# Patient Record
Sex: Female | Born: 1970 | Race: White | Hispanic: No | Marital: Married | Smoking: Never smoker
Health system: Southern US, Community
[De-identification: ages and names within clinical notes are randomized; demographics above are authoritative.]

---

## 2014-05-29 ENCOUNTER — Ambulatory Visit: Payer: Self-pay | Admitting: Family Medicine

## 2014-12-08 ENCOUNTER — Ambulatory Visit: Payer: Self-pay | Admitting: Physician Assistant

## 2014-12-18 LAB — BETA STREP CULTURE(ARMC)

## 2016-04-15 IMAGING — CR DG KNEE COMPLETE 4+V*L*
4 series · 4 of 4 positions shown · non-contrast
Comparison: None.

CLINICAL DATA: Pain and swelling

EXAM:
LEFT KNEE - COMPLETE 4+ VIEW

[knee ap]
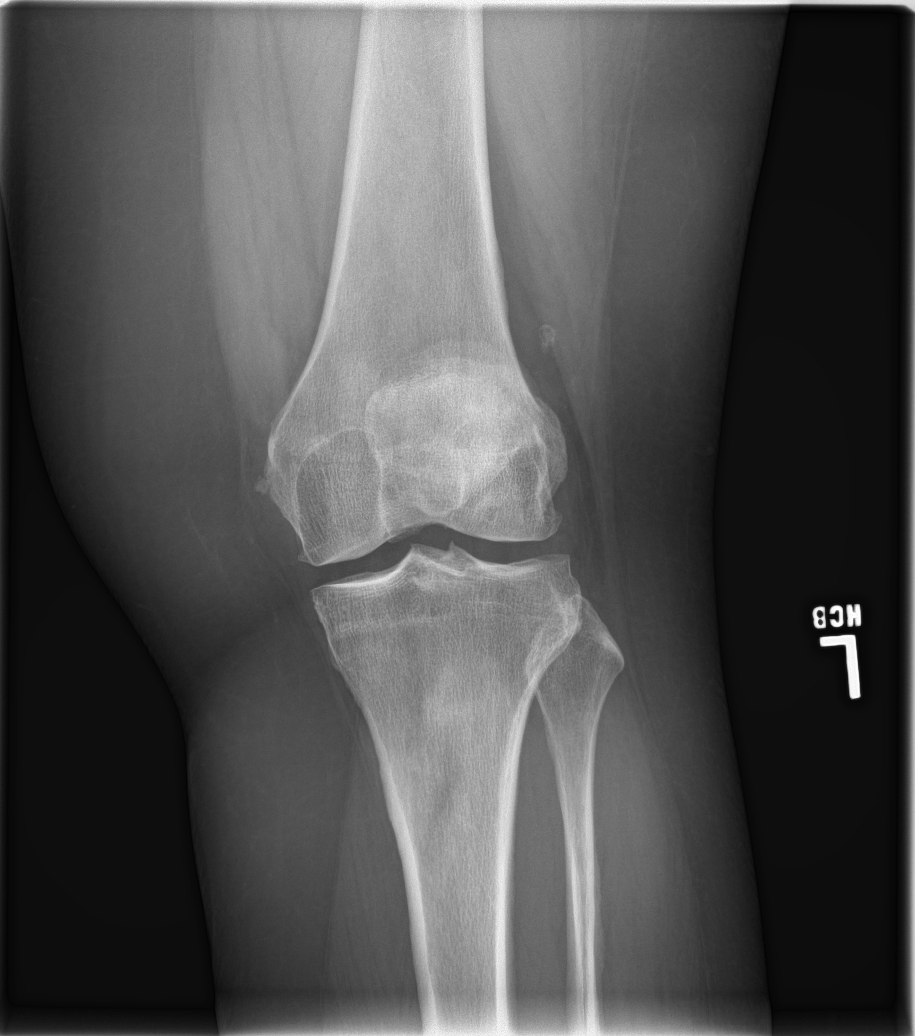

[knee obl (1 of 2)]
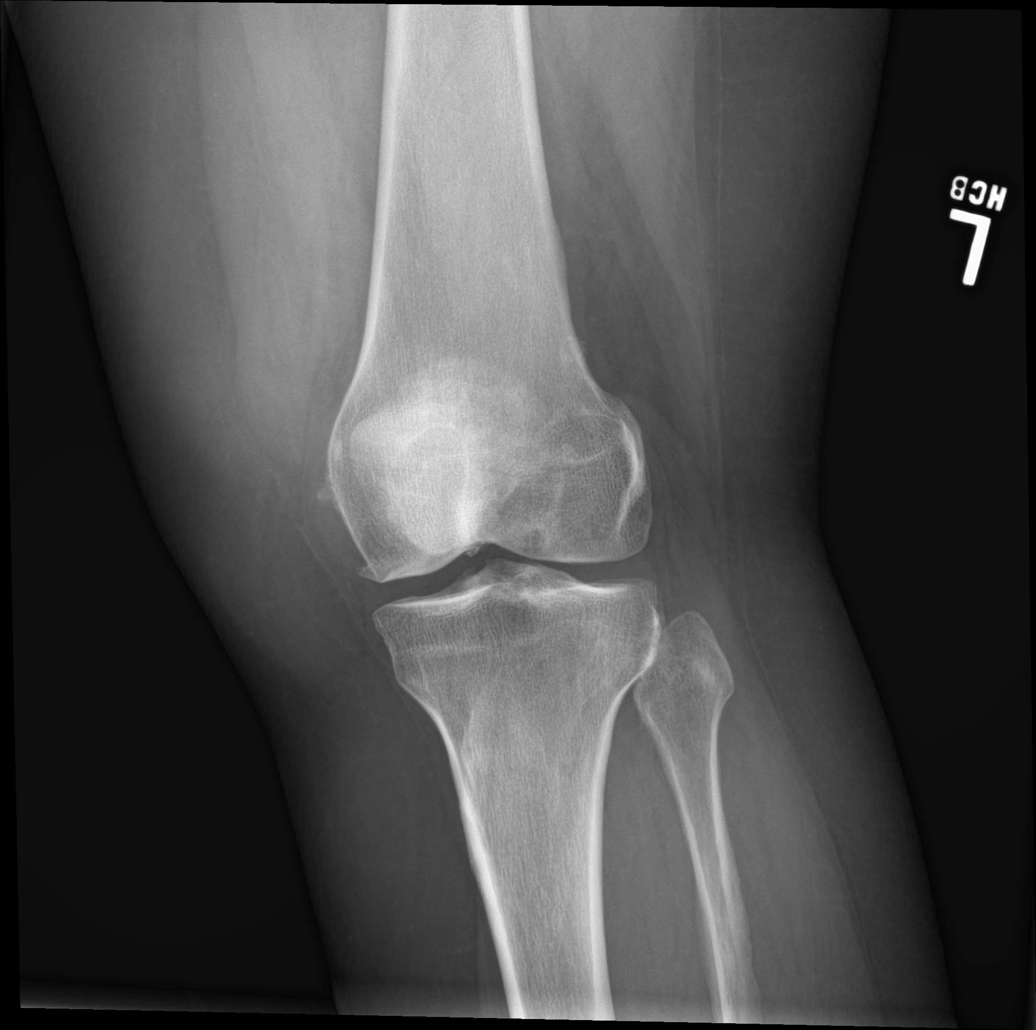

[knee obl (2 of 2)]
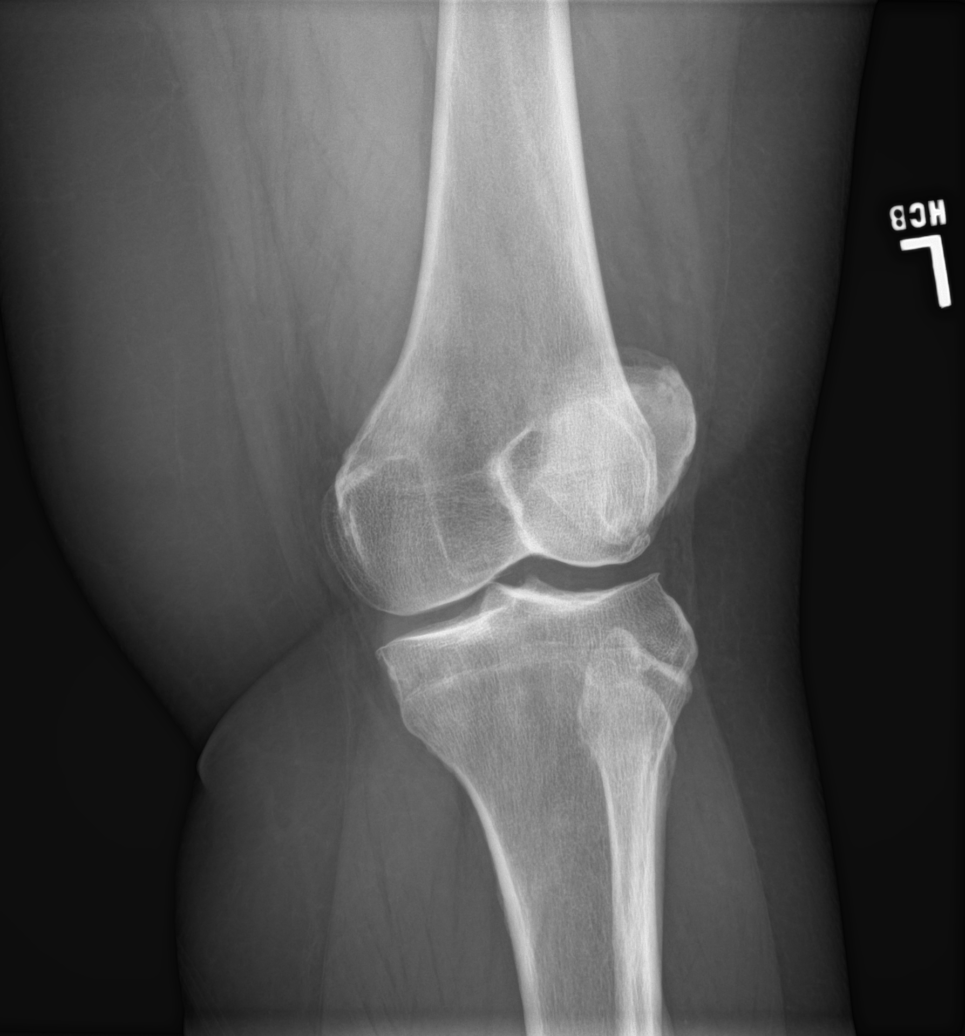

[knee lat]
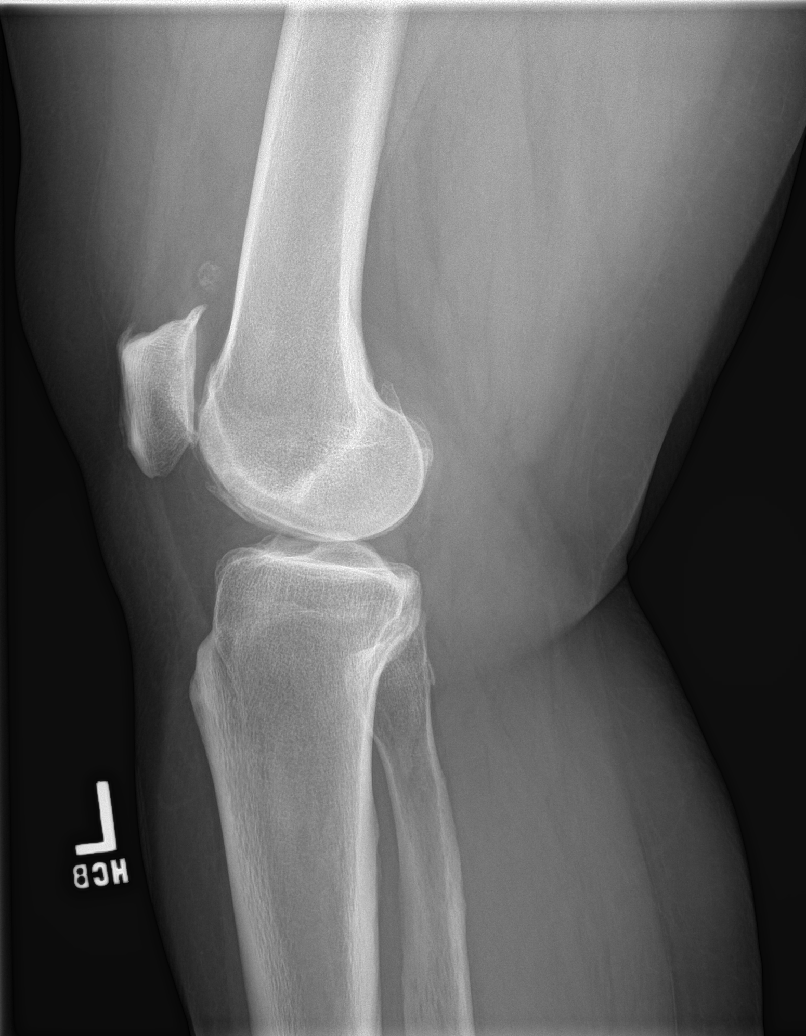

[4 of 4 positions shown; findings below may reference images not displayed]

FINDINGS: Mild degenerative changes are noted in all 3 joint compartments. No
significant joint effusion is seen. A calcification is noted
superior to the patella which may represent loose body. No other
focal abnormality is seen.
IMPRESSION: Degenerative changes without acute abnormality.

## 2016-07-25 ENCOUNTER — Ambulatory Visit (INDEPENDENT_AMBULATORY_CARE_PROVIDER_SITE_OTHER): Payer: No Typology Code available for payment source

## 2016-07-25 ENCOUNTER — Ambulatory Visit
Admission: EM | Admit: 2016-07-25 | Discharge: 2016-07-25 | Disposition: A | Discharge status: Home / Self Care | Payer: No Typology Code available for payment source | Attending: Family Medicine | Admitting: Family Medicine

## 2016-07-25 DIAGNOSIS — J189 Pneumonia, unspecified organism: Secondary | ICD-10-CM

## 2016-07-25 MED ORDER — ALBUTEROL SULFATE HFA 108 (90 BASE) MCG/ACT IN AERS: 1.0000 | INHALATION_SPRAY | Freq: Four times a day (QID) | RESPIRATORY_TRACT | 0 refills | Status: AC | PRN

## 2016-07-25 MED ORDER — AZITHROMYCIN 250 MG PO TABS: ORAL_TABLET | ORAL | 0 refills | Status: AC

## 2016-07-25 NOTE — ED Triage Notes (Signed)
Pt complains of not being able to take a good deep breath. It is easier to breath laying flat. She has anxiety with not being able to breath.

## 2016-07-25 NOTE — ED Provider Notes (Signed)
[facility]    CSN: [id number] Arrival date & time: 07/25/16  1048     History   Chief Complaint Chief Complaint  Patient presents with  . Respiratory Distress    Breathing Difficulty     HPI Brenda Marshall is a 45 y.o. female.   45 yo female with a c/o shortness of breath for 5 days, associated with intermittent dry coughing spells. States was exposed to co-worker diagnosed with pneumonia last week. Denies chest pain, fevers, chills, calf pain, recent surgery, prolonged immobilization, nasal congestion, runny nose.    The history is provided by the patient.    History reviewed. No pertinent past medical history.  There are no active problems to display for this patient.   History reviewed. No pertinent surgical history.  OB History    No data available       Home Medications    Prior to Admission medications   Medication Sig Start Date End Date Taking? Authorizing Provider  albuterol (PROVENTIL HFA;VENTOLIN HFA) 108 (90 Base) MCG/ACT inhaler Inhale 1-2 puffs into the lungs every 6 (six) hours as needed for wheezing or shortness of breath. 07/25/16   Payton Mccallumrlando Dajiah Kooi, MD  azithromycin (ZITHROMAX Z-PAK) 250 MG tablet 2 tabs po once day 1, then 1 tab po qd for next 4 days 07/25/16   Payton Mccallumrlando Hunter Pinkard, MD    Family History History reviewed. No pertinent family history.  Social History Social History  Substance Use Topics  . Smoking status: Never Smoker  . Smokeless tobacco: Never Used  . Alcohol use No     Allergies   Patient has no known allergies.   Review of Systems Review of Systems   Physical Exam Triage Vital Signs ED Triage Vitals  Enc Vitals Group     BP 07/25/16 1127 125/79     Pulse Rate 07/25/16 1127 70     Resp 07/25/16 1127 18     Temp 07/25/16 1127 98.2 F (36.8 C)     Temp Source 07/25/16 1127 Oral     SpO2 07/25/16 1127 98 %     Weight 07/25/16 1125 230 lb (104.3 kg)     Height 07/25/16 1125 5\' 1"  (1.549 m)     Head  Circumference --      Peak Flow --      Pain Score 07/25/16 1126 2     Pain Loc --      Pain Edu? --      Excl. in GC? --    No data found.   Updated Vital Signs BP 125/79 (BP Location: Left Arm)   Pulse 70   Temp 98.2 F (36.8 C) (Oral)   Resp 18   Ht 5\' 1"  (1.549 m)   Wt 230 lb (104.3 kg)   LMP 07/10/2016 (Exact Date) Comment: denies preg  SpO2 98%   BMI 43.46 kg/m   Visual Acuity Right Eye Distance:   Left Eye Distance:   Bilateral Distance:    Right Eye Near:   Left Eye Near:    Bilateral Near:     Physical Exam  Constitutional: She appears well-developed and well-nourished. No distress.  HENT:  Head: Normocephalic and atraumatic.  Right Ear: Tympanic membrane, external ear and ear canal normal.  Left Ear: Tympanic membrane, external ear and ear canal normal.  Nose: No mucosal edema, rhinorrhea, nose lacerations, sinus tenderness, nasal deformity, septal deviation or nasal septal hematoma. No epistaxis.  No foreign bodies. Right sinus exhibits no maxillary sinus  tenderness and no frontal sinus tenderness. Left sinus exhibits no maxillary sinus tenderness and no frontal sinus tenderness.  Mouth/Throat: Uvula is midline, oropharynx is clear and moist and mucous membranes are normal. No oropharyngeal exudate.  Eyes: Conjunctivae and EOM are normal. Pupils are equal, round, and reactive to light. Right eye exhibits no discharge. Left eye exhibits no discharge. No scleral icterus.  Neck: Normal range of motion. Neck supple. No thyromegaly present.  Cardiovascular: Normal rate, regular rhythm and normal heart sounds.   Pulmonary/Chest: Effort normal. No respiratory distress. She has no wheezes. She has rales (left lung).  Musculoskeletal: She exhibits no edema.  Lymphadenopathy:    She has no cervical adenopathy.  Skin: She is not diaphoretic.  Nursing note and vitals reviewed.    UC Treatments / Results  Labs (all labs ordered are listed, but only abnormal results  are displayed) Labs Reviewed - No data to display  EKG  EKG Interpretation None       Radiology Dg Chest 2 View  Result Date: 07/25/2016 CLINICAL DATA:  Nine days of productive cough; shortness of breath and left posterior chest discomfort for the past 3-4 days. EXAM: CHEST  2 VIEW COMPARISON:  No in PACs FINDINGS: The lungs are well-expanded. There is no focal infiltrate. There is no pleural effusion or pneumothorax. The heart and pulmonary vascularity are normal. The mediastinum is normal in width. The observed bony thorax exhibits no acute abnormality. IMPRESSION: There is no active cardiopulmonary disease. Electronically Signed   By: David  Swazil[place] M.D.   On: 07/25/2016 12:10    Procedures Procedures (including critical care time)  Medications Ordered in UC Medications - No data to display   Initial Impression / Assessment and Plan / UC Course  I have reviewed the triage vital signs and the nursing notes.  Pertinent labs & imaging results that were available during my care of the patient were reviewed by me and considered in my medical decision making (see chart for details).  Clinical Course       Final Clinical Impressions(s) / UC Diagnoses   Final diagnoses:  Atypical pneumonia    New Prescriptions Discharge Medication List as of 07/25/2016 12:37 PM    START taking these medications   Details  albuterol (PROVENTIL HFA;VENTOLIN HFA) 108 (90 Base) MCG/ACT inhaler Inhale 1-2 puffs into the lungs every 6 (six) hours as needed for wheezing or shortness of breath., Starting Tue 07/25/2016, Normal    azithromycin (ZITHROMAX Z-PAK) 250 MG tablet 2 tabs po once day 1, then 1 tab po qd for next 4 days, Normal       1. x-ray results and diagnosis reviewed with patient 2. rx as per orders above; reviewed possible side effects, interactions, risks and benefits  3. Recommend supportive treatment with rest, fluids 4. Follow-up prn if symptoms worsen or don't improve     Payton Mccallumrlando Trevone Prestwood, MD 07/25/16 1242

## 2016-07-28 ENCOUNTER — Telehealth: Payer: Self-pay | Admitting: *Deleted

## 2016-07-28 NOTE — Telephone Encounter (Signed)
Courtesy call back, verified DOB, patient reported feeling much better today. Advised patient to follow up with PCP or [facility] if symptoms return.

## 2017-05-18 ENCOUNTER — Ambulatory Visit
Admission: EM | Admit: 2017-05-18 | Discharge: 2017-05-18 | Disposition: A | Discharge status: Home / Self Care | Payer: 59 | Attending: Family Medicine | Admitting: Family Medicine

## 2017-05-18 DIAGNOSIS — J020 Streptococcal pharyngitis: Secondary | ICD-10-CM

## 2017-05-18 DIAGNOSIS — M26609 Unspecified temporomandibular joint disorder, unspecified side: Secondary | ICD-10-CM | POA: Diagnosis not present

## 2017-05-18 LAB — RAPID STREP SCREEN (MED CTR MEBANE ONLY): STREPTOCOCCUS, GROUP A SCREEN (DIRECT): POSITIVE — AB

## 2017-05-18 MED ORDER — PENICILLIN V POTASSIUM 500 MG PO TABS: 500.0000 mg | ORAL_TABLET | Freq: Three times a day (TID) | ORAL | 0 refills | Status: AC

## 2017-05-18 NOTE — ED Provider Notes (Signed)
[facility]    CSN: [id number] Arrival date & time: 05/18/17  1348     History   Chief Complaint Chief Complaint  Patient presents with  . Sore Throat    HPI Brenda Marshall is a 46 y.o. female.   46 yo female with a c/o right sided jaw pain for 4 days and 2 days of sore throat. States that occasionally she grinds her teeth at night while sleeping. Denies any injuries, fevers, chills.    The history is provided by the patient.  Sore Throat     History reviewed. No pertinent past medical history.  There are no active problems to display for this patient.   History reviewed. No pertinent surgical history.  OB History    No data available       Home Medications    Prior to Admission medications   Medication Sig Start Date End Date Taking? Authorizing Provider  albuterol (PROVENTIL HFA;VENTOLIN HFA) 108 (90 Base) MCG/ACT inhaler Inhale 1-2 puffs into the lungs every 6 (six) hours as needed for wheezing or shortness of breath. 07/25/16   Payton Mccallum, MD  azithromycin (ZITHROMAX Z-PAK) 250 MG tablet 2 tabs po once day 1, then 1 tab po qd for next 4 days 07/25/16   Payton Mccallum, MD  penicillin v potassium (VEETID) 500 MG tablet Take 1 tablet (500 mg total) by mouth 3 (three) times daily. 05/18/17   Payton Mccallum, MD    Family History History reviewed. No pertinent family history.  Social History Social History  Substance Use Topics  . Smoking status: Never Smoker  . Smokeless tobacco: Never Used  . Alcohol use No     Allergies   Patient has no known allergies.   Review of Systems Review of Systems   Physical Exam Triage Vital Signs ED Triage Vitals  Enc Vitals Group     BP 05/18/17 1400 (!) 146/89     Pulse Rate 05/18/17 1400 62     Resp 05/18/17 1400 16     Temp 05/18/17 1400 99.1 F (37.3 C)     Temp Source 05/18/17 1400 Oral     SpO2 05/18/17 1400 100 %     Weight 05/18/17 1402 243 lb 5 oz (110.4 kg)     Height 05/18/17 1402  5\' 1"  (1.549 m)     Head Circumference --      Peak Flow --      Pain Score 05/18/17 1402 3     Pain Loc --      Pain Edu? --      Excl. in GC? --    No data found.   Updated Vital Signs BP (!) 146/89 (BP Location: Left Arm)   Pulse 62   Temp 99.1 F (37.3 C) (Oral)   Resp 16   Ht 5\' 1"  (1.549 m)   Wt 243 lb 5 oz (110.4 kg)   LMP 05/04/2017   SpO2 100%   BMI 45.97 kg/m   Visual Acuity Right Eye Distance:   Left Eye Distance:   Bilateral Distance:    Right Eye Near:   Left Eye Near:    Bilateral Near:     Physical Exam  Constitutional: She appears well-developed and well-nourished. No distress.  HENT:  Right Ear: Tympanic membrane and external ear normal. No drainage.  Left Ear: External ear normal.  Mouth/Throat: Posterior oropharyngeal erythema present. No oropharyngeal exudate, posterior oropharyngeal edema or tonsillar abscesses. No tonsillar exudate.  Tenderness to palpation  over the right TMJ  Neck: Trachea normal.  Pulmonary/Chest: No stridor.  Skin: She is not diaphoretic.  Nursing note and vitals reviewed.    UC Treatments / Results  Labs (all labs ordered are listed, but only abnormal results are displayed) Labs Reviewed  RAPID STREP SCREEN (NOT AT [facility]) - Abnormal; Notable for the following:       Result Value   Streptococcus, Group A Screen (Direct) POSITIVE (*)    All other components within normal limits    EKG  EKG Interpretation None       Radiology No results found.  Procedures Procedures (including critical care time)  Medications Ordered in UC Medications - No data to display   Initial Impression / Assessment and Plan / UC Course  I have reviewed the triage vital signs and the nursing notes.  Pertinent labs & imaging results that were available during my care of the patient were reviewed by me and considered in my medical decision making (see chart for details).       Final Clinical Impressions(s) / UC Diagnoses    Final diagnoses:  TMJ (temporomandibular joint syndrome)  Strep pharyngitis    New Prescriptions Discharge Medication List as of 05/18/2017  2:48 PM    START taking these medications   Details  penicillin v potassium (VEETID) 500 MG tablet Take 1 tablet (500 mg total) by mouth 3 (three) times daily., Starting Fri 05/18/2017, Normal       1. Lab results and diagnosis reviewed with patient 2. rx as per orders above; reviewed possible side effects, interactions, risks and benefits  3. Recommend supportive treatment with salt water gargles, otc analgesics/NSAIDs  4. Follow-up prn if symptoms worsen or don't improve  Controlled Substance Prescriptions [place] Controlled Substance Registry consulted? Not Applicable   Payton Mccallumonty, Lorelee Mclaurin, MD 05/18/17 1950

## 2017-05-18 NOTE — Discharge Instructions (Signed)
Motrin/advil as need 3 times per day Ice to right jaw area

## 2017-05-18 NOTE — ED Triage Notes (Signed)
Pt with sore throat on right side and jaw pain 3/10 since Tuesday

## 2018-06-12 IMAGING — CR DG CHEST 2V
2 series · 2 of 2 positions shown · non-contrast
Comparison: No in PACs

CLINICAL DATA: Nine days of productive cough; shortness of breath
and left posterior chest discomfort for the past 3-4 days.

EXAM:
CHEST  2 VIEW

[chest pa]
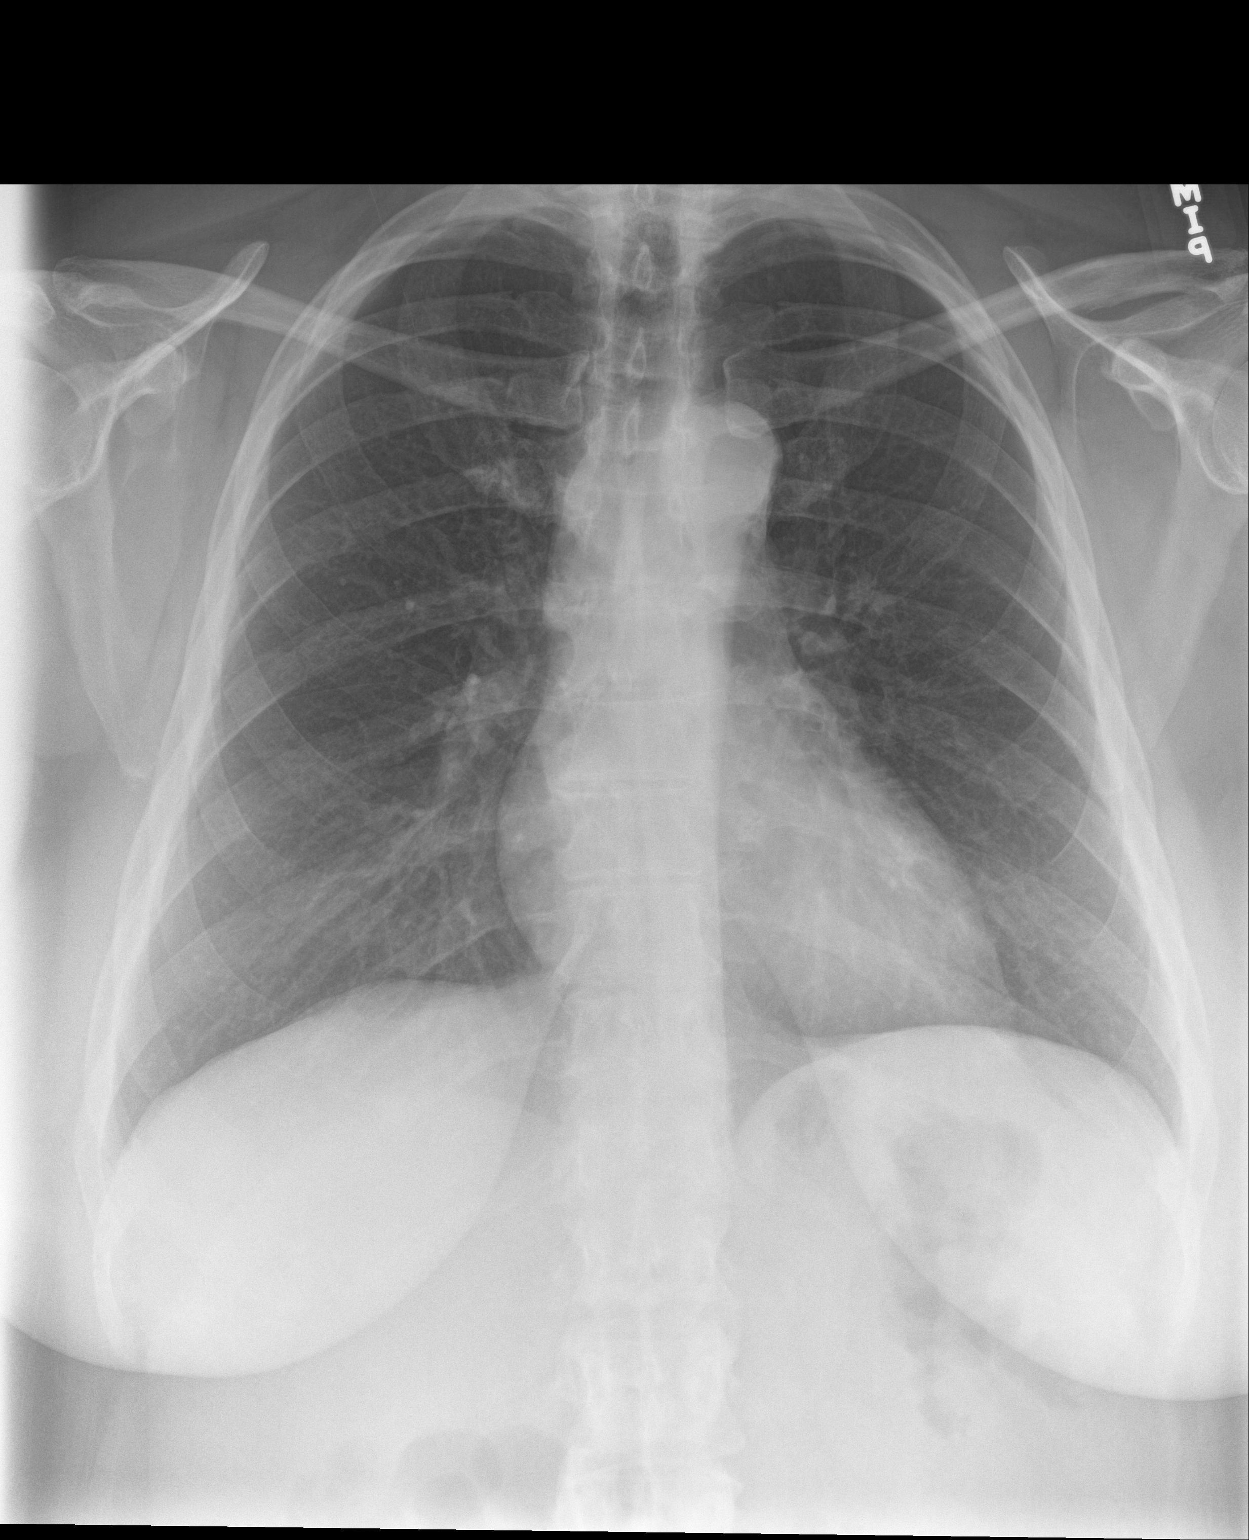

[chest lat]
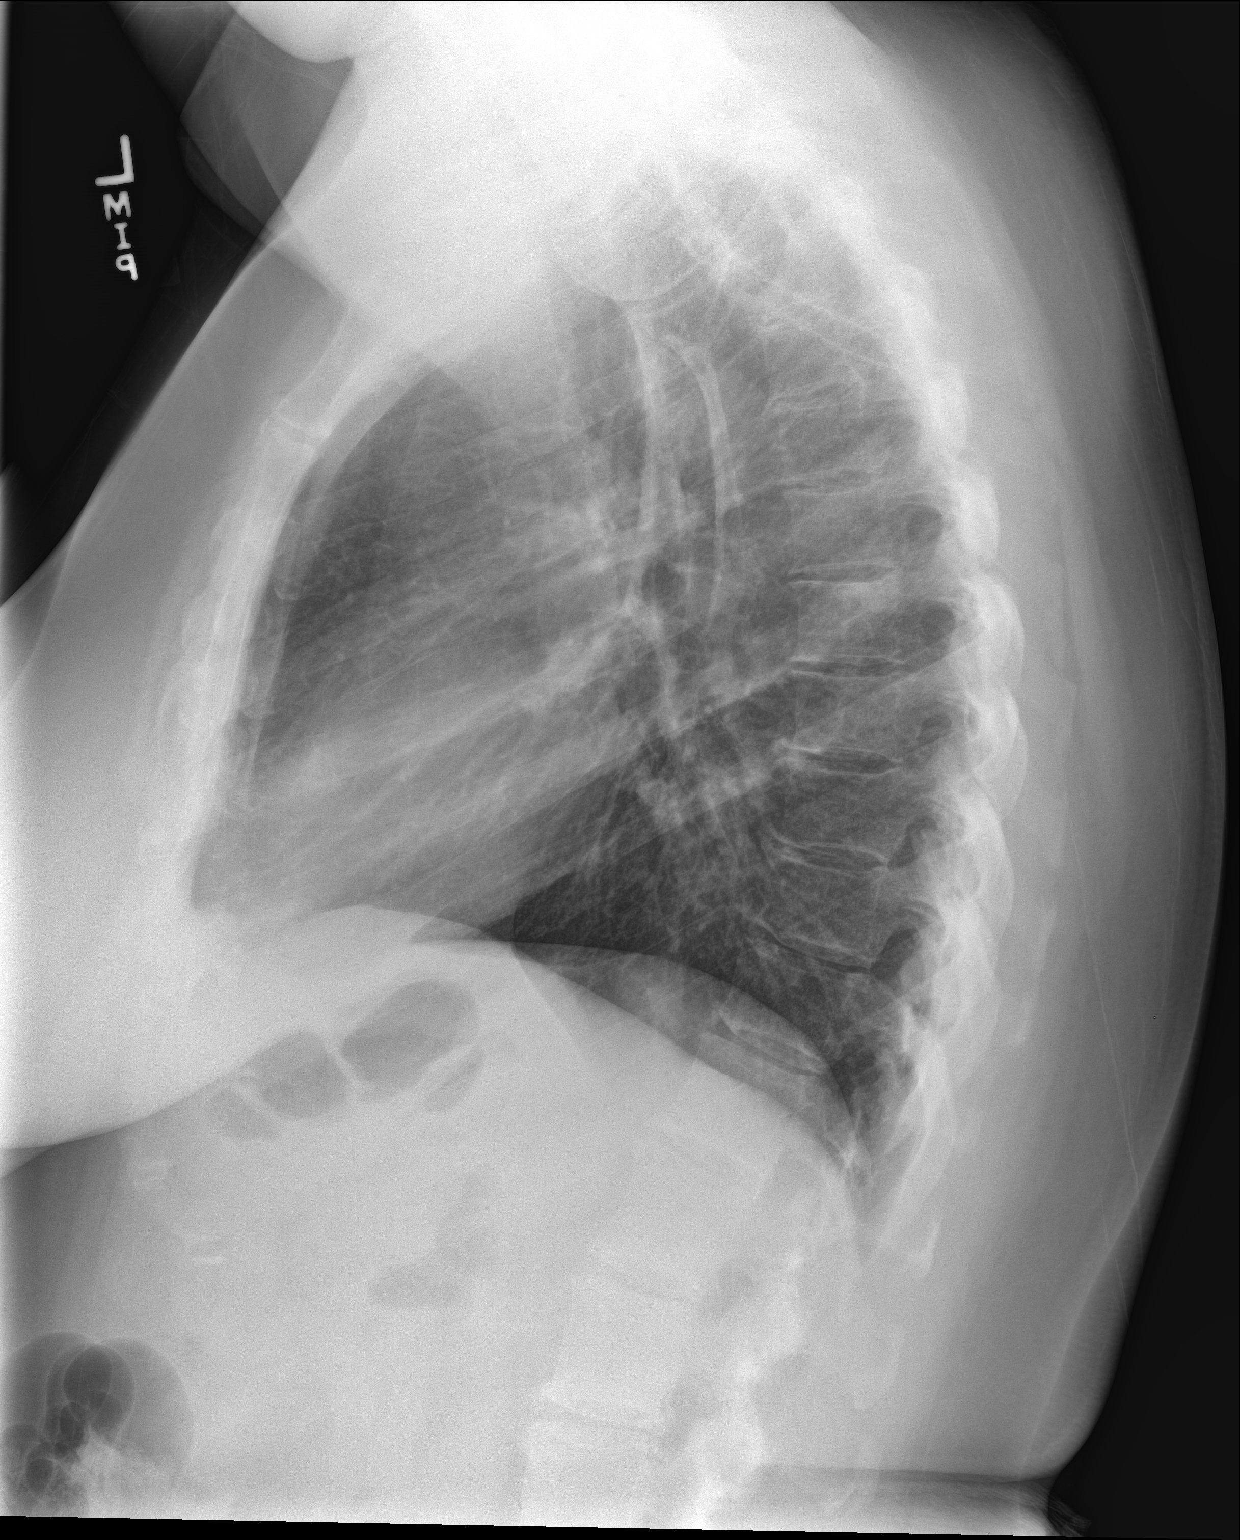

[2 of 2 positions shown; findings below may reference images not displayed]

FINDINGS: The lungs are well-expanded. There is no focal infiltrate. There is
no pleural effusion or pneumothorax. The heart and pulmonary
vascularity are normal. The mediastinum is normal in width. The
observed bony thorax exhibits no acute abnormality.
IMPRESSION: There is no active cardiopulmonary disease.

## 2020-06-16 ENCOUNTER — Telehealth: Payer: 59 | Admitting: Family

## 2020-06-16 DIAGNOSIS — T63424A Toxic effect of venom of ants, undetermined, initial encounter: Secondary | ICD-10-CM | POA: Diagnosis not present

## 2020-06-16 NOTE — Progress Notes (Signed)
E Visit for Insect Sting  Thank you for describing the insect sting for [place].  Here is how we plan to help!  Based on the information you have shared with me it looks like you have: An uncomplicated insect sting that just occurred and can be closely followed using the instructions in your care plan.  The 2 greatest risks from insect stings are allergic reaction, which can be fatal in some people and infection, which is more common and less serious.  Bees, wasps, yellow jackets, and hornets belong to a class of insects called Hymenoptera.  Most insect stings cause only minor discomfort.  Stings can happen anywhere on the body and can be painful.  Most stings are from honey bees or yellow jackets.  Fire ants can sting multiple times.  The sites of the stings are more likely to become infected.    Provided a home care guide for insect stings and instructions on when to call for help.   A lot of times it can cause scaring. Given you are not having any pain, I would continue to monitor it.   What can be used to prevent Insect Stings?   Insect repellant with at least 20% DEET.    Wearing long pants and shirts with socks and shoes.    Wear dark or drab-colored clothes rather than bright colors.    Avoid using perfumes and hair sprays; these attract insects.  HOME CARE ADVICE:  1. Stinger removal:  The stinger looks like a tiny black dot in the sting.  Use a fingernail, credit card edge, or knife-edge to scrape it off.  Don't pull it out because it squeezes out more venom.  If the stinger is below the skin surface, leave it alone.  It will be shed with normal skin healing. 2. Use cold compresses to the area of the sting for 10-20 minutes.  You may repeat this as needed to relieve symptoms of pain and swelling. 3.  For pain relief, take acetominophen 650 mg 4-6 hours as needed or ibuprofen 400 mg every 6-8 hours as needed or naproxen 250-500 mg every 12 hours as needed. 4.  You can  also use hydrocortisone cream 0.5% or 1% up to 4 times daily as needed for itching. 5.  If the sting becomes very itchy, take Benadryl 25-50 mg, follow directions on box. 6.  Wash the area 2-3 times daily with antibacterial soap and warm water. 7. Call your Doctor if:  Fever, a severe headache, or rash occur in the next 2 weeks.  Sting area begins to look infected.  Redness and swelling worsens after home treatment.  Your current symptoms become worse.    MAKE SURE YOU:   Understand these instructions.  Will watch your condition.  Will get help right away if you are not doing well or get worse.  Thank you for choosing an e-visit. Your e-visit answers were reviewed by a board certified advanced clinical practitioner to complete your personal care plan. Depending upon the condition, your plan could have included both over the counter or prescription medications. Please review your pharmacy choice. Be sure that the pharmacy you have chosen is open so that you can pick up your prescription now.  If there is a problem you may message your provider in MyChart to have the prescription routed to another pharmacy. Your safety is important to [place]. If you have drug allergies check your prescription carefully.  For the next 24 hours, you can use MyChart  to ask questions about today's visit, request a non-urgent call back, or ask for a work or school excuse from your e-visit provider. You will get an email in the next two days asking about your experience. I hope that your e-visit has been valuable and will speed your recovery.

## 2021-12-01 ENCOUNTER — Telehealth: Payer: 59 | Admitting: Physician Assistant

## 2021-12-01 DIAGNOSIS — R3989 Other symptoms and signs involving the genitourinary system: Secondary | ICD-10-CM

## 2021-12-01 MED ORDER — SULFAMETHOXAZOLE-TRIMETHOPRIM 800-160 MG PO TABS: 1.0000 | ORAL_TABLET | Freq: Two times a day (BID) | ORAL | 0 refills | Status: AC

## 2021-12-01 NOTE — Patient Instructions (Signed)
?Aris [place], thank you for joining Margaretann Loveless, PA-C for today's virtual visit.  While this provider is not your primary care provider (PCP), if your PCP is located in our provider database this encounter information will be shared with them immediately following your visit. ? ?Consent: ?(Patient) Brenda Marshall provided verbal consent for this virtual visit at the beginning of the encounter. ? ?Current Medications: ? ?Current Outpatient Medications:  ?  sulfamethoxazole-trimethoprim (BACTRIM DS) 800-160 MG tablet, Take 1 tablet by mouth 2 (two) times daily., Disp: 10 tablet, Rfl: 0 ?  albuterol (PROVENTIL HFA;VENTOLIN HFA) 108 (90 Base) MCG/ACT inhaler, Inhale 1-2 puffs into the lungs every 6 (six) hours as needed for wheezing or shortness of breath., Disp: 1 Inhaler, Rfl: 0 ?  azithromycin (ZITHROMAX Z-PAK) 250 MG tablet, 2 tabs po once day 1, then 1 tab po qd for next 4 days, Disp: 6 each, Rfl: 0 ?  penicillin v potassium (VEETID) 500 MG tablet, Take 1 tablet (500 mg total) by mouth 3 (three) times daily., Disp: 30 tablet, Rfl: 0  ? ?Medications ordered in this encounter:  ?Meds ordered this encounter  ?Medications  ? sulfamethoxazole-trimethoprim (BACTRIM DS) 800-160 MG tablet  ?  Sig: Take 1 tablet by mouth 2 (two) times daily.  ?  Dispense:  10 tablet  ?  Refill:  0  ?  Order Specific Question:   Supervising Provider  ?  Answer:   Eber Hong [[id number]]  ?  ? ?*If you need refills on other medications prior to your next appointment, please contact your pharmacy* ? ?Follow-Up: ?Call back or seek an in-person evaluation if the symptoms worsen or if the condition fails to improve as anticipated. ? ?Other Instructions ?Urinary Tract Infection, Adult ?A urinary tract infection (UTI) is an infection of any part of the urinary tract. The urinary tract includes the kidneys, ureters, bladder, and urethra. These organs make, store, and get rid of urine in the body. ?An upper UTI affects the ureters and  kidneys. A lower UTI affects the bladder and urethra. ?What are the causes? ?Most urinary tract infections are caused by bacteria in your genital area around your urethra, where urine leaves your body. These bacteria grow and cause inflammation of your urinary tract. ?What increases the risk? ?You are more likely to develop this condition if: ?You have a urinary catheter that stays in place. ?You are not able to control when you urinate or have a bowel movement (incontinence). ?You are female and you: ?Use a spermicide or diaphragm for birth control. ?Have low estrogen levels. ?Are pregnant. ?You have certain genes that increase your risk. ?You are sexually active. ?You take antibiotic medicines. ?You have a condition that causes your flow of urine to slow down, such as: ?An enlarged prostate, if you are female. ?Blockage in your urethra. ?A kidney [place]. ?A nerve condition that affects your bladder control (neurogenic bladder). ?Not getting enough to drink, or not urinating often. ?You have certain medical conditions, such as: ?Diabetes. ?A weak disease-fighting system (immunesystem). ?Sickle cell disease. ?Gout. ?Spinal cord injury. ?What are the signs or symptoms? ?Symptoms of this condition include: ?Needing to urinate right away (urgency). ?Frequent urination. This may include small amounts of urine each time you urinate. ?Pain or burning with urination. ?Blood in the urine. ?Urine that smells bad or unusual. ?Trouble urinating. ?Cloudy urine. ?Vaginal discharge, if you are female. ?Pain in the abdomen or the lower back. ?You may also have: ?Vomiting or a decreased appetite. ?  Confusion. ?Irritability or tiredness. ?A fever or chills. ?Diarrhea. ?The first symptom in older adults may be confusion. In some cases, they may not have any symptoms until the infection has worsened. ?How is this diagnosed? ?This condition is diagnosed based on your medical history and a physical exam. You may also have other tests,  including: ?Urine tests. ?Blood tests. ?Tests for STIs (sexually transmitted infections). ?If you have had more than one UTI, a cystoscopy or imaging studies may be done to determine the cause of the infections. ?How is this treated? ?Treatment for this condition includes: ?Antibiotic medicine. ?Over-the-counter medicines to treat discomfort. ?Drinking enough water to stay hydrated. ?If you have frequent infections or have other conditions such as a kidney [place], you may need to see a health care provider who specializes in the urinary tract (urologist). ?In rare cases, urinary tract infections can cause sepsis. Sepsis is a life-threatening condition that occurs when the body responds to an infection. Sepsis is treated in the hospital with IV antibiotics, fluids, and other medicines. ?Follow these instructions at home: ?Medicines ?Take over-the-counter and prescription medicines only as told by your health care provider. ?If you were prescribed an antibiotic medicine, take it as told by your health care provider. Do not stop using the antibiotic even if you start to feel better. ?General instructions ?Make sure you: ?Empty your bladder often and completely. Do not hold urine for long periods of time. ?Empty your bladder after sex. ?Wipe from front to back after urinating or having a bowel movement if you are female. Use each tissue only one time when you wipe. ?Drink enough fluid to keep your urine pale yellow. ?Keep all follow-up visits. This is important. ?Contact a health care provider if: ?Your symptoms do not get better after 1-2 days. ?Your symptoms go away and then return. ?Get help right away if: ?You have severe pain in your back or your lower abdomen. ?You have a fever or chills. ?You have nausea or vomiting. ?Summary ?A urinary tract infection (UTI) is an infection of any part of the urinary tract, which includes the kidneys, ureters, bladder, and urethra. ?Most urinary tract infections are caused by  bacteria in your genital area. ?Treatment for this condition often includes antibiotic medicines. ?If you were prescribed an antibiotic medicine, take it as told by your health care provider. Do not stop using the antibiotic even if you start to feel better. ?Keep all follow-up visits. This is important. ?This information is not intended to replace advice given to you by your health care provider. Make sure you discuss any questions you have with your health care provider. ?Document Revised: 04/16/2020 Document Reviewed: 04/16/2020 ?Elsevier Patient Education ? 2022 [employer] ? ? ? ?If you have been instructed to have an in-person evaluation today at a local Urgent Care facility, please use the link below. It will take you to a list of all of our available [facility], including address, phone number and hours of operation. Please do not delay care.  ?[facility] ? ?If you or a family member do not have a primary care provider, use the link below to schedule a visit and establish care. When you choose a [facility] primary care physician or advanced practice provider, you gain a long-term partner in health. ?Find a Primary Care Provider ? ?Learn more about [facility]'s in-office and virtual care options: ?[facility] - Get Care Now ?

## 2021-12-01 NOTE — Progress Notes (Signed)
?Virtual Visit Consent  ? ?Brenda Marshall, you are scheduled for a virtual visit with a [facility] provider today.   ?  ?Just as with appointments in the office, your consent must be obtained to participate.  Your consent will be active for this visit and any virtual visit you may have with one of our providers in the next 365 days.   ?  ?If you have a MyChart account, a copy of this consent can be sent to you electronically.  All virtual visits are billed to your insurance company just like a traditional visit in the office.   ? ?As this is a virtual visit, video technology does not allow for your provider to perform a traditional examination.  This may limit your provider's ability to fully assess your condition.  If your provider identifies any concerns that need to be evaluated in person or the need to arrange testing (such as labs, EKG, etc.), we will make arrangements to do so.   ?  ?Although advances in technology are sophisticated, we cannot ensure that it will always work on either your end or our end.  If the connection with a video visit is poor, the visit may have to be switched to a telephone visit.  With either a video or telephone visit, we are not always able to ensure that we have a secure connection.    ? ?I need to obtain your verbal consent now.   Are you willing to proceed with your visit today?  ?  ?Brenda Marshall has provided verbal consent on 12/01/2021 for a virtual visit (video or telephone). ?  ?[name], PA-C  ? ?Date: 12/01/2021 9:49 AM ? ? ?Virtual Visit via Video Note  ? ?IMar Daring, connected with  Brenda Marshall  ([id number], May 03, 1971) on 12/01/21 at  9:45 AM EDT by a video-enabled telemedicine application and verified that I am speaking with the correct person using two identifiers. ? ?Location: ?Patient: Virtual Visit Location Patient: Home ?Provider: Virtual Visit Location Provider: Home Office ?  ?I discussed the limitations of evaluation and  management by telemedicine and the availability of in person appointments. The patient expressed understanding and agreed to proceed.   ? ?History of Present Illness: ?Brenda Marshall is a 51 y.o. who identifies as a female who was assigned female at birth, and is being seen today for possible UTI. ? ?HPI: Urinary Tract Infection  ?This is a new problem. The current episode started 1 to 4 weeks ago (Wednesday last week). The problem has been gradually worsening. The quality of the pain is described as aching and burning. There has been no fever. Associated symptoms include frequency, hesitancy and urgency. Pertinent negatives include no chills, discharge, flank pain, hematuria or nausea. She has tried increased fluids for the symptoms. The treatment provided no relief. There is no history of catheterization, recurrent UTIs or a urological procedure.   ? ? ?Problems: There are no problems to display for this patient. ?  ?Allergies: No Known Allergies ?Medications:  ?Current Outpatient Medications:  ?  sulfamethoxazole-trimethoprim (BACTRIM DS) 800-160 MG tablet, Take 1 tablet by mouth 2 (two) times daily., Disp: 10 tablet, Rfl: 0 ?  albuterol (PROVENTIL HFA;VENTOLIN HFA) 108 (90 Base) MCG/ACT inhaler, Inhale 1-2 puffs into the lungs every 6 (six) hours as needed for wheezing or shortness of breath., Disp: 1 Inhaler, Rfl: 0 ?  azithromycin (ZITHROMAX Z-PAK) 250 MG tablet, 2 tabs po once day 1, then 1  tab po qd for next 4 days, Disp: 6 each, Rfl: 0 ?  penicillin v potassium (VEETID) 500 MG tablet, Take 1 tablet (500 mg total) by mouth 3 (three) times daily., Disp: 30 tablet, Rfl: 0 ? ?Observations/Objective: ?Patient is well-developed, well-nourished in no acute distress.  ?Resting comfortably at home.  ?Head is normocephalic, atraumatic.  ?No labored breathing.  ?Speech is clear and coherent with logical content.  ?Patient is alert and oriented at baseline.  ? ? ?Assessment and Plan: ?1. Suspected UTI ?-  sulfamethoxazole-trimethoprim (BACTRIM DS) 800-160 MG tablet; Take 1 tablet by mouth 2 (two) times daily.  Dispense: 10 tablet; Refill: 0 ? ?- Worsening symptoms.  ?- Will treat empirically with Bactrim  ?- Continue to push fluids.  ?- She is to seek in person evaluation if symptoms do not improve or if they worsen.  ? ? ?Follow Up Instructions: ?I discussed the assessment and treatment plan with the patient. The patient was provided an opportunity to ask questions and all were answered. The patient agreed with the plan and demonstrated an understanding of the instructions.  A copy of instructions were sent to the patient via MyChart unless otherwise noted below.  ? ? ?The patient was advised to call back or seek an in-person evaluation if the symptoms worsen or if the condition fails to improve as anticipated. ? ?Time:  ?I spent 8 minutes with the patient via telehealth technology discussing the above problems/concerns.   ? ?[name], PA-C ?
